# Patient Record
Sex: Male | Born: 1975 | Race: Black or African American | Hispanic: No | Marital: Married | Smoking: Former smoker
Health system: Southern US, Community
[De-identification: ages and names within clinical notes are randomized; demographics above are authoritative.]

## PROBLEM LIST (undated history)

## (undated) DIAGNOSIS — K5792 Diverticulitis of intestine, part unspecified, without perforation or abscess without bleeding: Secondary | ICD-10-CM

## (undated) DIAGNOSIS — T7840XA Allergy, unspecified, initial encounter: Secondary | ICD-10-CM

## (undated) DIAGNOSIS — F410 Panic disorder [episodic paroxysmal anxiety] without agoraphobia: Secondary | ICD-10-CM

## (undated) HISTORY — DX: Panic disorder (episodic paroxysmal anxiety): F41.0

## (undated) HISTORY — DX: Allergy, unspecified, initial encounter: T78.40XA

## (undated) HISTORY — PX: FRACTURE SURGERY: SHX138

## (undated) HISTORY — DX: Diverticulitis of intestine, part unspecified, without perforation or abscess without bleeding: K57.92

## (undated) HISTORY — PX: ABDOMINAL SURGERY: SHX537

## (undated) HISTORY — PX: FACIAL COSMETIC SURGERY: SHX629

---

## 2010-05-19 ENCOUNTER — Inpatient Hospital Stay (HOSPITAL_COMMUNITY): Admission: EM | Admit: 2010-05-19 | Discharge: 2010-05-20 | Payer: Self-pay | Admitting: Emergency Medicine

## 2011-01-11 LAB — BASIC METABOLIC PANEL
BUN: 8 mg/dL (ref 6–23)
CO2: 29 mEq/L (ref 19–32)
Chloride: 104 mEq/L (ref 96–112)
Chloride: 107 mEq/L (ref 96–112)
Creatinine, Ser: 1.04 mg/dL (ref 0.4–1.5)
GFR calc Af Amer: >60 mL/min (ref >60)
Glucose, Bld: 101 mg/dL — ABNORMAL HIGH (ref 70–99)
Potassium: 3.5 mEq/L (ref 3.5–5.1)
Sodium: 137 mEq/L (ref 135–145)
Sodium: 138 mEq/L (ref 135–145)

## 2011-01-11 LAB — CBC
HCT: 41.5 % (ref 39.0–52.0)
MCHC: 34.3 g/dL (ref 30.0–36.0)
MCV: 91.8 fL (ref 78.0–100.0)
Platelets: 222 10*3/uL (ref 150–400)
RBC: 4.53 MIL/uL (ref 4.22–5.81)
WBC: 5.4 10*3/uL (ref 4.0–10.5)

## 2011-01-11 LAB — HEMOGLOBIN A1C: Mean Plasma Glucose: 117 mg/dL — ABNORMAL HIGH (ref <117)

## 2011-01-11 LAB — URINALYSIS, ROUTINE W REFLEX MICROSCOPIC
Bilirubin Urine: NEGATIVE
Nitrite: NEGATIVE
Specific Gravity, Urine: 1.025 (ref 1.005–1.030)

## 2011-01-11 LAB — HOMOCYSTEINE: Homocysteine: 9.5 umol/L (ref 4.0–15.4)

## 2011-01-11 LAB — DIFFERENTIAL
Basophils Relative: 1 % (ref 0–1)
Monocytes Absolute: 0.5 10*3/uL (ref 0.1–1.0)
Monocytes Relative: 10 % (ref 3–12)
Neutro Abs: 2.8 10*3/uL (ref 1.7–7.7)

## 2011-01-11 LAB — LIPID PANEL
Cholesterol: 188 mg/dL (ref 0–200)
LDL Cholesterol: 124 mg/dL — ABNORMAL HIGH (ref 0–99)
Triglycerides: 166 mg/dL — ABNORMAL HIGH (ref <150)

## 2011-01-11 LAB — POCT CARDIAC MARKERS
CKMB, poc: 1.4 ng/mL (ref 1.0–8.0)
Myoglobin, poc: 111 ng/mL (ref 12–200)
Troponin i, poc: <0.05 ng/mL (ref 0.00–0.09)

## 2011-01-11 LAB — URINE CULTURE

## 2011-01-11 LAB — CARDIAC PANEL(CRET KIN+CKTOT+MB+TROPI)
CK, MB: 2.4 ng/mL (ref 0.3–4.0)
Relative Index: 0.5 (ref 0.0–2.5)
Total CK: 470 U/L — ABNORMAL HIGH (ref 7–232)
Troponin I: 0.01 ng/mL (ref 0.00–0.06)

## 2011-01-11 LAB — APTT: aPTT: 32 seconds (ref 24–37)

## 2011-01-11 LAB — RAPID URINE DRUG SCREEN, HOSP PERFORMED
Amphetamines: NOT DETECTED
Barbiturates: NOT DETECTED
Benzodiazepines: NOT DETECTED
Opiates: NOT DETECTED

## 2012-08-17 ENCOUNTER — Encounter (HOSPITAL_COMMUNITY): Payer: Self-pay

## 2012-08-17 ENCOUNTER — Emergency Department (HOSPITAL_COMMUNITY)
Admission: EM | Admit: 2012-08-17 | Discharge: 2012-08-17 | Disposition: A | Discharge status: Home / Self Care | Payer: Managed Care, Other (non HMO) | Attending: Emergency Medicine | Admitting: Emergency Medicine

## 2012-08-17 DIAGNOSIS — F17200 Nicotine dependence, unspecified, uncomplicated: Secondary | ICD-10-CM | POA: Insufficient documentation

## 2012-08-17 DIAGNOSIS — Z23 Encounter for immunization: Secondary | ICD-10-CM | POA: Insufficient documentation

## 2012-08-17 DIAGNOSIS — S90569A Insect bite (nonvenomous), unspecified ankle, initial encounter: Secondary | ICD-10-CM | POA: Insufficient documentation

## 2012-08-17 DIAGNOSIS — W57XXXA Bitten or stung by nonvenomous insect and other nonvenomous arthropods, initial encounter: Secondary | ICD-10-CM

## 2012-08-17 DIAGNOSIS — Y9389 Activity, other specified: Secondary | ICD-10-CM | POA: Insufficient documentation

## 2012-08-17 DIAGNOSIS — Y92009 Unspecified place in unspecified non-institutional (private) residence as the place of occurrence of the external cause: Secondary | ICD-10-CM | POA: Insufficient documentation

## 2012-08-17 MED ORDER — TETANUS-DIPHTH-ACELL PERTUSSIS 5-2.5-18.5 LF-MCG/0.5 IM SUSP
0.5000 mL | Freq: Once | INTRAMUSCULAR | Status: AC
  Administered 2012-08-17: 0.5 mL via INTRAMUSCULAR
  Filled 2012-08-17: qty 0.5
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
## 2012-08-17 NOTE — ED Notes (Signed)
Pt reports approx 2 hours ago, that it felt like something "jumped up and bit me" on right lateral leg, just below knee.  States this happened while he was cutting down trees.  Two small ?puncture wounds noted to right lateral knee.  No swelling noted to area; ambulatory w/o difficulty.

## 2012-08-17 NOTE — ED Notes (Signed)
Pt reports felt something bite him on his r leg.  Pt says lips feel a little numb and says pain is shooting down r leg.  Pt has 2 small puncture wounds to r lower leg.  Area slightly discolored around wound.  Pt says did not see what bit him.  Says he was cutting some trees down.

## 2012-11-16 ENCOUNTER — Emergency Department (HOSPITAL_COMMUNITY)
Admission: EM | Admit: 2012-11-16 | Discharge: 2012-11-16 | Disposition: A | Discharge status: Home / Self Care | Payer: Managed Care, Other (non HMO) | Attending: Emergency Medicine | Admitting: Emergency Medicine

## 2012-11-16 ENCOUNTER — Emergency Department (HOSPITAL_COMMUNITY): Payer: Managed Care, Other (non HMO)

## 2012-11-16 ENCOUNTER — Encounter (HOSPITAL_COMMUNITY): Payer: Self-pay | Admitting: *Deleted

## 2012-11-16 DIAGNOSIS — W010XXA Fall on same level from slipping, tripping and stumbling without subsequent striking against object, initial encounter: Secondary | ICD-10-CM | POA: Insufficient documentation

## 2012-11-16 DIAGNOSIS — I252 Old myocardial infarction: Secondary | ICD-10-CM | POA: Insufficient documentation

## 2012-11-16 DIAGNOSIS — X500XXA Overexertion from strenuous movement or load, initial encounter: Secondary | ICD-10-CM | POA: Insufficient documentation

## 2012-11-16 DIAGNOSIS — F17200 Nicotine dependence, unspecified, uncomplicated: Secondary | ICD-10-CM | POA: Insufficient documentation

## 2012-11-16 DIAGNOSIS — S93409A Sprain of unspecified ligament of unspecified ankle, initial encounter: Secondary | ICD-10-CM | POA: Insufficient documentation

## 2012-11-16 DIAGNOSIS — Y939 Activity, unspecified: Secondary | ICD-10-CM | POA: Insufficient documentation

## 2012-11-16 DIAGNOSIS — Y9289 Other specified places as the place of occurrence of the external cause: Secondary | ICD-10-CM | POA: Insufficient documentation

## 2012-11-16 MED ORDER — HYDROCODONE-ACETAMINOPHEN 5-325 MG PO TABS: 1.0000 | ORAL_TABLET | Freq: Four times a day (QID) | ORAL | Status: DC | PRN

## 2012-11-16 MED ORDER — HYDROCODONE-ACETAMINOPHEN 5-325 MG PO TABS
1.0000 | ORAL_TABLET | Freq: Once | ORAL | Status: AC
  Administered 2012-11-16: 1 via ORAL
  Filled 2012-11-16: qty 1

## 2012-11-16 NOTE — ED Notes (Signed)
Pain,  Rt foot, slid and fell injurying rt foot.
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
## 2012-11-19 NOTE — ED Provider Notes (Signed)
Medical screening examination/treatment/procedure(s) were performed by non-physician practitioner and as supervising physician I was immediately available for consultation/collaboration.  Glynn Octave, MD 11/19/12 [phone]

## 2012-11-30 ENCOUNTER — Emergency Department (HOSPITAL_COMMUNITY): Payer: Managed Care, Other (non HMO)

## 2012-11-30 ENCOUNTER — Emergency Department (HOSPITAL_COMMUNITY)
Admission: EM | Admit: 2012-11-30 | Discharge: 2012-11-30 | Disposition: A | Discharge status: Home / Self Care | Payer: Managed Care, Other (non HMO) | Attending: Emergency Medicine | Admitting: Emergency Medicine

## 2012-11-30 ENCOUNTER — Encounter (HOSPITAL_COMMUNITY): Payer: Self-pay | Admitting: *Deleted

## 2012-11-30 DIAGNOSIS — I252 Old myocardial infarction: Secondary | ICD-10-CM | POA: Insufficient documentation

## 2012-11-30 DIAGNOSIS — K5732 Diverticulitis of large intestine without perforation or abscess without bleeding: Secondary | ICD-10-CM | POA: Insufficient documentation

## 2012-11-30 DIAGNOSIS — R0602 Shortness of breath: Secondary | ICD-10-CM | POA: Insufficient documentation

## 2012-11-30 DIAGNOSIS — K5792 Diverticulitis of intestine, part unspecified, without perforation or abscess without bleeding: Secondary | ICD-10-CM

## 2012-11-30 DIAGNOSIS — F17200 Nicotine dependence, unspecified, uncomplicated: Secondary | ICD-10-CM | POA: Insufficient documentation

## 2012-11-30 LAB — CBC WITH DIFFERENTIAL/PLATELET
Basophils Relative: 0 % (ref 0–1)
Lymphs Abs: 1.7 10*3/uL (ref 0.7–4.0)
MCHC: 35.1 g/dL (ref 30.0–36.0)
Monocytes Relative: 7 % (ref 3–12)
Neutro Abs: 4.5 10*3/uL (ref 1.7–7.7)
Neutrophils Relative %: 65 % (ref 43–77)
Platelets: 252 10*3/uL (ref 150–400)
RBC: 4.91 MIL/uL (ref 4.22–5.81)
WBC: 6.9 10*3/uL (ref 4.0–10.5)

## 2012-11-30 LAB — COMPREHENSIVE METABOLIC PANEL
BUN: 13 mg/dL (ref 6–23)
Calcium: 9.2 mg/dL (ref 8.4–10.5)
Creatinine, Ser: 0.95 mg/dL (ref 0.50–1.35)
GFR calc Af Amer: >90 mL/min (ref >90)
GFR calc non Af Amer: >90 mL/min (ref >90)
Glucose, Bld: 135 mg/dL — ABNORMAL HIGH (ref 70–99)
Total Protein: 7.5 g/dL (ref 6.0–8.3)

## 2012-11-30 LAB — LIPASE, BLOOD: Lipase: 20 U/L (ref 11–59)

## 2012-11-30 LAB — URINALYSIS, ROUTINE W REFLEX MICROSCOPIC
Nitrite: NEGATIVE
Protein, ur: NEGATIVE mg/dL
Specific Gravity, Urine: 1.02 (ref 1.005–1.030)
Urobilinogen, UA: 4 mg/dL — ABNORMAL HIGH (ref 0.0–1.0)

## 2012-11-30 MED ORDER — METRONIDAZOLE 500 MG PO TABS: 500.0000 mg | ORAL_TABLET | Freq: Two times a day (BID) | ORAL | Status: DC

## 2012-11-30 MED ORDER — SODIUM CHLORIDE 0.9 % IV SOLN
1000.0000 mL | INTRAVENOUS | Status: DC
  Administered 2012-11-30: 1000 mL via INTRAVENOUS

## 2012-11-30 MED ORDER — HYDROCODONE-ACETAMINOPHEN 5-325 MG PO TABS: 1.0000 | ORAL_TABLET | ORAL | Status: AC | PRN

## 2012-11-30 MED ORDER — IOHEXOL 300 MG/ML  SOLN
100.0000 mL | Freq: Once | INTRAMUSCULAR | Status: AC | PRN
  Administered 2012-11-30: 100 mL via INTRAVENOUS

## 2012-11-30 MED ORDER — IOHEXOL 300 MG/ML  SOLN
50.0000 mL | Freq: Once | INTRAMUSCULAR | Status: AC | PRN
  Administered 2012-11-30: 50 mL via ORAL

## 2012-11-30 MED ORDER — CIPROFLOXACIN HCL 500 MG PO TABS: 500.0000 mg | ORAL_TABLET | Freq: Two times a day (BID) | ORAL | Status: DC

## 2012-11-30 NOTE — ED Notes (Signed)
C/o lower abd pain onset 4 days ago; denies n/v/d; states last normal BM this morning; denies urinary s/s.  Also c/o lower back pain.
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
## 2017-12-30 ENCOUNTER — Ambulatory Visit (INDEPENDENT_AMBULATORY_CARE_PROVIDER_SITE_OTHER): Payer: Medicaid Other | Admitting: Family Medicine

## 2017-12-30 ENCOUNTER — Encounter: Payer: Self-pay | Admitting: Family Medicine

## 2017-12-30 ENCOUNTER — Other Ambulatory Visit (HOSPITAL_COMMUNITY)
Admission: RE | Admit: 2017-12-30 | Discharge: 2017-12-30 | Disposition: A | Discharge status: Home / Self Care | Payer: Medicaid Other | Source: Ambulatory Visit | Attending: Family Medicine | Admitting: Family Medicine

## 2017-12-30 ENCOUNTER — Other Ambulatory Visit: Payer: Self-pay

## 2017-12-30 VITALS — BP 120/80 | HR 88 | Temp 97.8°F | Resp 16 | Ht 69.0 in | Wt 198.0 lb

## 2017-12-30 DIAGNOSIS — Z0000 Encounter for general adult medical examination without abnormal findings: Secondary | ICD-10-CM | POA: Diagnosis not present

## 2017-12-30 DIAGNOSIS — E782 Mixed hyperlipidemia: Secondary | ICD-10-CM | POA: Insufficient documentation

## 2017-12-30 DIAGNOSIS — Z720 Tobacco use: Secondary | ICD-10-CM | POA: Insufficient documentation

## 2017-12-30 DIAGNOSIS — F1290 Cannabis use, unspecified, uncomplicated: Secondary | ICD-10-CM | POA: Diagnosis not present

## 2017-12-30 DIAGNOSIS — Z23 Encounter for immunization: Secondary | ICD-10-CM

## 2017-12-30 DIAGNOSIS — Z113 Encounter for screening for infections with a predominantly sexual mode of transmission: Secondary | ICD-10-CM | POA: Insufficient documentation

## 2017-12-30 DIAGNOSIS — R7303 Prediabetes: Secondary | ICD-10-CM
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
## 2017-12-31 ENCOUNTER — Other Ambulatory Visit: Payer: Self-pay

## 2017-12-31 ENCOUNTER — Emergency Department (HOSPITAL_COMMUNITY)
Admission: EM | Admit: 2017-12-31 | Discharge: 2017-12-31 | Disposition: A | Discharge status: Home / Self Care | Payer: Medicaid Other | Attending: Emergency Medicine | Admitting: Emergency Medicine

## 2017-12-31 ENCOUNTER — Encounter (HOSPITAL_COMMUNITY): Payer: Self-pay | Admitting: Emergency Medicine

## 2017-12-31 DIAGNOSIS — M542 Cervicalgia: Secondary | ICD-10-CM | POA: Insufficient documentation

## 2017-12-31 DIAGNOSIS — Z5321 Procedure and treatment not carried out due to patient leaving prior to being seen by health care provider: Secondary | ICD-10-CM | POA: Insufficient documentation

## 2017-12-31 LAB — LIPID PANEL
Cholesterol: 224 mg/dL — ABNORMAL HIGH (ref <200)
HDL: 51 mg/dL (ref >40)
LDL Cholesterol (Calc): 153 mg/dL (calc) — ABNORMAL HIGH
Non-HDL Cholesterol (Calc): 173 mg/dL (calc) — ABNORMAL HIGH (ref <130)
Total CHOL/HDL Ratio: 4.4 (calc) (ref <5.0)
Triglycerides: 90 mg/dL (ref <150)

## 2017-12-31 LAB — CBC WITH DIFFERENTIAL/PLATELET
Basophils Absolute: 29 cells/uL (ref 0–200)
Basophils Relative: 0.4 %
Eosinophils Absolute: 292 cells/uL (ref 15–500)
Eosinophils Relative: 4 %
HCT: 43.4 % (ref 38.5–50.0)
Hemoglobin: 15.1 g/dL (ref 13.2–17.1)
Lymphs Abs: 2190 cells/uL (ref 850–3900)
MCH: 31.2 pg (ref 27.0–33.0)
MCHC: 34.8 g/dL (ref 32.0–36.0)
MCV: 89.7 fL (ref 80.0–100.0)
MPV: 9.2 fL (ref 7.5–12.5)
Monocytes Relative: 6.7 %
Neutro Abs: 4300 cells/uL (ref 1500–7800)
Neutrophils Relative %: 58.9 %
Platelets: 267 10*3/uL (ref 140–400)
RBC: 4.84 10*6/uL (ref 4.20–5.80)
RDW: 12 % (ref 11.0–15.0)
Total Lymphocyte: 30 %
WBC mixed population: 489 cells/uL (ref 200–950)
WBC: 7.3 10*3/uL (ref 3.8–10.8)

## 2017-12-31 LAB — COMPLETE METABOLIC PANEL WITH GFR
AG RATIO: 1.8 (calc) (ref 1.0–2.5)
ALT: 23 U/L (ref 9–46)
AST: 19 U/L (ref 10–40)
Albumin: 4.5 g/dL (ref 3.6–5.1)
Alkaline phosphatase (APISO): 61 U/L (ref 40–115)
BUN: 14 mg/dL (ref 7–25)
CO2: 27 mmol/L (ref 20–32)
Calcium: 9.4 mg/dL (ref 8.6–10.3)
Chloride: 106 mmol/L (ref 98–110)
Creat: 1.11 mg/dL (ref 0.60–1.35)
GFR, EST NON AFRICAN AMERICAN: 81 mL/min/{1.73_m2} (ref >=60)
GFR, Est African American: 94 mL/min/{1.73_m2} (ref >=60)
Globulin: 2.5 g/dL (calc) (ref 1.9–3.7)
Glucose, Bld: 90 mg/dL (ref 65–139)
POTASSIUM: 4.6 mmol/L (ref 3.5–5.3)
Sodium: 140 mmol/L (ref 135–146)
Total Bilirubin: 0.5 mg/dL (ref 0.2–1.2)
Total Protein: 7 g/dL (ref 6.1–8.1)

## 2017-12-31 LAB — HEMOGLOBIN A1C
Hgb A1c MFr Bld: 5.5 % of total Hgb (ref <5.7)
Mean Plasma Glucose: 111 (calc)
eAG (mmol/L): 6.2 (calc)

## 2017-12-31 LAB — HEPATITIS PANEL, ACUTE
Hep A IgM: NONREACTIVE
Hep B C IgM: NONREACTIVE
Hepatitis B Surface Ag: NONREACTIVE
Hepatitis C Ab: NONREACTIVE
SIGNAL TO CUT-OFF: 0.02 (ref <1.00)

## 2017-12-31 LAB — URINALYSIS, ROUTINE W REFLEX MICROSCOPIC
Bilirubin Urine: NEGATIVE
GLUCOSE, UA: NEGATIVE
Hgb urine dipstick: NEGATIVE
Ketones, ur: NEGATIVE
LEUKOCYTES UA: NEGATIVE
Nitrite: NEGATIVE
PH: 5.5 (ref 5.0–8.0)
Protein, ur: NEGATIVE
Specific Gravity, Urine: 1.013 (ref 1.001–1.03)

## 2017-12-31 LAB — URINE CYTOLOGY ANCILLARY ONLY
CHLAMYDIA, DNA PROBE: NEGATIVE
Neisseria Gonorrhea: NEGATIVE
TRICH (WINDOWPATH): NEGATIVE

## 2017-12-31 LAB — PSA, TOTAL AND FREE
PSA, % Free: 40 % (calc) (ref >25)
PSA, Free: 0.2 ng/mL
PSA, TOTAL: 0.5 ng/mL (ref <=4.0)

## 2017-12-31 LAB — HIV ANTIBODY (ROUTINE TESTING W REFLEX): HIV 1&2 Ab, 4th Generation: NONREACTIVE

## 2017-12-31 LAB — RPR: RPR Ser Ql: NONREACTIVE
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
## 2017-12-31 NOTE — ED Notes (Addendum)
Called to reassess - no answer in waiting room x 2.

## 2017-12-31 NOTE — ED Notes (Signed)
No answer in waiting room X1,  

## 2017-12-31 NOTE — ED Notes (Signed)
No answer x 3 in waiting and surrounding area.

## 2018-01-05 ENCOUNTER — Encounter: Payer: Self-pay | Admitting: Family Medicine

## 2018-01-20 ENCOUNTER — Encounter (HOSPITAL_COMMUNITY): Payer: Self-pay | Admitting: Emergency Medicine

## 2018-01-20 ENCOUNTER — Emergency Department (HOSPITAL_COMMUNITY): Payer: Medicaid Other

## 2018-01-20 ENCOUNTER — Emergency Department (HOSPITAL_COMMUNITY)
Admission: EM | Admit: 2018-01-20 | Discharge: 2018-01-20 | Disposition: A | Discharge status: Home / Self Care | Payer: Medicaid Other | Attending: Emergency Medicine | Admitting: Emergency Medicine

## 2018-01-20 ENCOUNTER — Other Ambulatory Visit: Payer: Self-pay

## 2018-01-20 DIAGNOSIS — Z79899 Other long term (current) drug therapy: Secondary | ICD-10-CM | POA: Diagnosis not present

## 2018-01-20 DIAGNOSIS — Y92410 Unspecified street and highway as the place of occurrence of the external cause: Secondary | ICD-10-CM | POA: Diagnosis not present

## 2018-01-20 DIAGNOSIS — S3992XA Unspecified injury of lower back, initial encounter: Secondary | ICD-10-CM | POA: Diagnosis present

## 2018-01-20 DIAGNOSIS — Y939 Activity, unspecified: Secondary | ICD-10-CM | POA: Diagnosis not present

## 2018-01-20 DIAGNOSIS — F17210 Nicotine dependence, cigarettes, uncomplicated: Secondary | ICD-10-CM | POA: Diagnosis not present

## 2018-01-20 DIAGNOSIS — M545 Low back pain, unspecified: Secondary | ICD-10-CM

## 2018-01-20 DIAGNOSIS — M542 Cervicalgia: Secondary | ICD-10-CM

## 2018-01-20 DIAGNOSIS — Y999 Unspecified external cause status: Secondary | ICD-10-CM | POA: Insufficient documentation

## 2018-01-20 MED ORDER — METHOCARBAMOL 500 MG PO TABS: 500.0000 mg | ORAL_TABLET | Freq: Two times a day (BID) | ORAL | 0 refills | Status: AC

## 2018-01-20 MED ORDER — DIAZEPAM 5 MG PO TABS
10.0000 mg | ORAL_TABLET | Freq: Once | ORAL | Status: AC
  Administered 2018-01-20: 10 mg via ORAL
  Filled 2018-01-20: qty 2

## 2018-01-20 MED ORDER — ONDANSETRON HCL 4 MG PO TABS
4.0000 mg | ORAL_TABLET | Freq: Once | ORAL | Status: AC
  Administered 2018-01-20: 4 mg via ORAL
  Filled 2018-01-20: qty 1

## 2018-01-20 MED ORDER — HYDROCODONE-ACETAMINOPHEN 5-325 MG PO TABS
2.0000 | ORAL_TABLET | Freq: Once | ORAL | Status: AC
Start: 2018-01-20 — End: 2018-01-20
  Administered 2018-01-20: 2 via ORAL
  Filled 2018-01-20: qty 2

## 2018-01-20 MED ORDER — DICLOFENAC SODIUM 75 MG PO TBEC: 75.0000 mg | DELAYED_RELEASE_TABLET | Freq: Two times a day (BID) | ORAL | 0 refills | Status: AC

## 2018-01-20 NOTE — Discharge Instructions (Addendum)
Return if any problems.  Follow up with your Physician for recheck  
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
## 2018-01-20 NOTE — ED Notes (Signed)
Pt taken to xray 
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
## 2018-01-20 NOTE — ED Provider Notes (Signed)
Pt's ct's returned.  Ct reviewed and discussed with pt Meds ordered this encounter  Medications  . diazepam (VALIUM) tablet 10 mg  . HYDROcodone-acetaminophen (NORCO/VICODIN) 5-325 MG per tablet 2 tablet  . ondansetron (ZOFRAN) tablet 4 mg  . diclofenac (VOLTAREN) 75 MG EC tablet    Sig: Take 1 tablet (75 mg total) by mouth 2 (two) times daily.    Dispense:  20 tablet    Refill:  0    Order Specific Question:   Supervising Provider    Answer:   MILLER, BRIAN [[id number]]  . methocarbamol (ROBAXIN) 500 MG tablet    Sig: Take 1 tablet (500 mg total) by mouth 2 (two) times daily.    Dispense:  20 tablet    Refill:  0    Order Specific Question:   Supervising Provider    Answer:   Eber HongMILLER, BRIAN [[id number]]   An After Visit Summary was printed and given to the patient.    Osie CheeksSofia, Leslie K, PA-C 01/20/18 1627    Benjiman CorePickering, Nathan, MD 01/21/18 [phone]

## 2018-01-27 ENCOUNTER — Ambulatory Visit: Payer: Medicaid Other | Admitting: Family Medicine

## 2018-02-15 ENCOUNTER — Encounter (HOSPITAL_COMMUNITY): Payer: Self-pay | Admitting: *Deleted

## 2018-02-15 ENCOUNTER — Emergency Department (HOSPITAL_COMMUNITY)
Admission: EM | Admit: 2018-02-15 | Discharge: 2018-02-15 | Disposition: A | Discharge status: Home / Self Care | Payer: Medicaid Other | Attending: Emergency Medicine | Admitting: Emergency Medicine

## 2018-02-15 ENCOUNTER — Emergency Department (HOSPITAL_COMMUNITY): Payer: Medicaid Other

## 2018-02-15 ENCOUNTER — Other Ambulatory Visit: Payer: Self-pay

## 2018-02-15 DIAGNOSIS — Z7982 Long term (current) use of aspirin: Secondary | ICD-10-CM | POA: Insufficient documentation

## 2018-02-15 DIAGNOSIS — Z87891 Personal history of nicotine dependence: Secondary | ICD-10-CM | POA: Insufficient documentation

## 2018-02-15 DIAGNOSIS — Y92410 Unspecified street and highway as the place of occurrence of the external cause: Secondary | ICD-10-CM | POA: Diagnosis not present

## 2018-02-15 DIAGNOSIS — Y9389 Activity, other specified: Secondary | ICD-10-CM | POA: Insufficient documentation

## 2018-02-15 DIAGNOSIS — Y999 Unspecified external cause status: Secondary | ICD-10-CM | POA: Insufficient documentation

## 2018-02-15 DIAGNOSIS — S76211A Strain of adductor muscle, fascia and tendon of right thigh, initial encounter: Secondary | ICD-10-CM | POA: Diagnosis not present

## 2018-02-15 DIAGNOSIS — Z79899 Other long term (current) drug therapy: Secondary | ICD-10-CM | POA: Insufficient documentation

## 2018-02-15 DIAGNOSIS — T07XXXA Unspecified multiple injuries, initial encounter: Secondary | ICD-10-CM | POA: Insufficient documentation

## 2018-02-15 MED ORDER — IBUPROFEN 800 MG PO TABS: 800.0000 mg | ORAL_TABLET | Freq: Three times a day (TID) | ORAL | 0 refills | Status: AC | PRN

## 2018-02-15 MED ORDER — BACITRACIN ZINC 500 UNIT/GM EX OINT: 1.0000 "application " | TOPICAL_OINTMENT | Freq: Two times a day (BID) | CUTANEOUS | 0 refills | Status: AC

## 2018-02-15 MED ORDER — IBUPROFEN 800 MG PO TABS
800.0000 mg | ORAL_TABLET | Freq: Once | ORAL | Status: AC
Start: 2018-02-15 — End: 2018-02-15
  Administered 2018-02-15: 800 mg via ORAL
  Filled 2018-02-15: qty 1

## 2018-02-15 MED ORDER — HYDROCODONE-ACETAMINOPHEN 5-325 MG PO TABS: 1.0000 | ORAL_TABLET | ORAL | 0 refills | Status: DC | PRN
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
## 2018-02-20 ENCOUNTER — Emergency Department (HOSPITAL_COMMUNITY)
Admission: EM | Admit: 2018-02-20 | Discharge: 2018-02-20 | Disposition: A | Discharge status: Home / Self Care | Payer: Medicaid Other | Attending: Emergency Medicine | Admitting: Emergency Medicine

## 2018-02-20 ENCOUNTER — Emergency Department (HOSPITAL_COMMUNITY): Payer: Medicaid Other

## 2018-02-20 ENCOUNTER — Encounter (HOSPITAL_COMMUNITY): Payer: Self-pay

## 2018-02-20 ENCOUNTER — Other Ambulatory Visit: Payer: Self-pay

## 2018-02-20 DIAGNOSIS — S40811A Abrasion of right upper arm, initial encounter: Secondary | ICD-10-CM | POA: Insufficient documentation

## 2018-02-20 DIAGNOSIS — Z79899 Other long term (current) drug therapy: Secondary | ICD-10-CM | POA: Diagnosis not present

## 2018-02-20 DIAGNOSIS — M25562 Pain in left knee: Secondary | ICD-10-CM | POA: Insufficient documentation

## 2018-02-20 DIAGNOSIS — Y929 Unspecified place or not applicable: Secondary | ICD-10-CM | POA: Insufficient documentation

## 2018-02-20 DIAGNOSIS — Y999 Unspecified external cause status: Secondary | ICD-10-CM | POA: Insufficient documentation

## 2018-02-20 DIAGNOSIS — Y939 Activity, unspecified: Secondary | ICD-10-CM | POA: Diagnosis not present

## 2018-02-20 DIAGNOSIS — Z87891 Personal history of nicotine dependence: Secondary | ICD-10-CM | POA: Insufficient documentation

## 2018-02-20 DIAGNOSIS — S4991XA Unspecified injury of right shoulder and upper arm, initial encounter: Secondary | ICD-10-CM | POA: Diagnosis present

## 2018-02-20 DIAGNOSIS — T07XXXA Unspecified multiple injuries, initial encounter: Secondary | ICD-10-CM

## 2018-02-20 LAB — CBC WITH DIFFERENTIAL/PLATELET
Basophils Absolute: 0 10*3/uL (ref 0.0–0.1)
Basophils Relative: 0 %
EOS PCT: 2 %
Eosinophils Absolute: 0.2 10*3/uL (ref 0.0–0.7)
HEMATOCRIT: 40.9 % (ref 39.0–52.0)
Hemoglobin: 13.5 g/dL (ref 13.0–17.0)
LYMPHS ABS: 1.6 10*3/uL (ref 0.7–4.0)
LYMPHS PCT: 18 %
MCH: 30.1 pg (ref 26.0–34.0)
MCHC: 33 g/dL (ref 30.0–36.0)
MCV: 91.3 fL (ref 78.0–100.0)
MONO ABS: 0.8 10*3/uL (ref 0.1–1.0)
MONOS PCT: 10 %
NEUTROS ABS: 6.2 10*3/uL (ref 1.7–7.7)
Neutrophils Relative %: 70 %
PLATELETS: 275 10*3/uL (ref 150–400)
RBC: 4.48 MIL/uL (ref 4.22–5.81)
RDW: 12.1 % (ref 11.5–15.5)
WBC: 8.7 10*3/uL (ref 4.0–10.5)

## 2018-02-20 LAB — BASIC METABOLIC PANEL
ANION GAP: 10 (ref 5–15)
BUN: 10 mg/dL (ref 6–20)
CHLORIDE: 102 mmol/L (ref 101–111)
CO2: 27 mmol/L (ref 22–32)
Calcium: 9 mg/dL (ref 8.9–10.3)
Creatinine, Ser: 0.87 mg/dL (ref 0.61–1.24)
GFR calc Af Amer: >60 mL/min (ref >60)
GFR calc non Af Amer: >60 mL/min (ref >60)
GLUCOSE: 106 mg/dL — AB (ref 65–99)
POTASSIUM: 3.5 mmol/L (ref 3.5–5.1)
Sodium: 139 mmol/L (ref 135–145)

## 2018-02-20 MED ORDER — BACITRACIN ZINC 500 UNIT/GM EX OINT
1.0000 "application " | TOPICAL_OINTMENT | Freq: Once | CUTANEOUS | Status: DC
  Filled 2018-02-20: qty 13.5

## 2018-02-20 MED ORDER — FENTANYL CITRATE (PF) 100 MCG/2ML IJ SOLN
100.0000 ug | Freq: Once | INTRAMUSCULAR | Status: AC
  Administered 2018-02-20: 100 ug via INTRAVENOUS
  Filled 2018-02-20: qty 2

## 2018-02-20 MED ORDER — BACITRACIN ZINC 500 UNIT/GM EX OINT
TOPICAL_OINTMENT | CUTANEOUS | Status: AC
  Filled 2018-02-20: qty 2.7

## 2018-02-20 MED ORDER — CEPHALEXIN 250 MG PO CAPS: 250.0000 mg | ORAL_CAPSULE | Freq: Four times a day (QID) | ORAL | 0 refills | Status: DC

## 2018-02-20 MED ORDER — KETOROLAC TROMETHAMINE 30 MG/ML IJ SOLN
30.0000 mg | Freq: Once | INTRAMUSCULAR | Status: AC
  Administered 2018-02-20: 30 mg via INTRAVENOUS
  Filled 2018-02-20: qty 1

## 2018-02-20 NOTE — ED Notes (Signed)
Pt returned from xray

## 2018-02-20 NOTE — ED Notes (Signed)
Patient transported to X-ray 

## 2018-02-20 NOTE — ED Notes (Signed)
EDP at bedside  

## 2018-02-20 NOTE — Discharge Instructions (Signed)
Continue the ibuprofen, you can also take Tylenol to supplement, apply antibiotic ointment to the wounds daily and keep a bandage covering the abrasions, consider seeing an orthopedic doctor if you continue to have pain in your knee your hand

## 2018-02-20 NOTE — ED Notes (Signed)
EDP at bedside updating patient and family. 
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
## 2018-04-02 ENCOUNTER — Encounter: Payer: Self-pay | Admitting: Family Medicine

## 2018-04-28 ENCOUNTER — Ambulatory Visit: Payer: Medicaid Other | Admitting: Family Medicine

## 2018-05-06 ENCOUNTER — Other Ambulatory Visit: Payer: Self-pay

## 2018-05-06 ENCOUNTER — Encounter: Payer: Self-pay | Admitting: Family Medicine

## 2018-05-06 ENCOUNTER — Ambulatory Visit (INDEPENDENT_AMBULATORY_CARE_PROVIDER_SITE_OTHER): Payer: Medicaid Other | Admitting: Family Medicine

## 2018-05-06 VITALS — BP 140/90 | HR 16 | Temp 98.5°F | Resp 16 | Ht 69.0 in | Wt 186.0 lb

## 2018-05-06 DIAGNOSIS — M25562 Pain in left knee: Secondary | ICD-10-CM | POA: Diagnosis not present

## 2018-05-06 DIAGNOSIS — F0781 Postconcussional syndrome: Secondary | ICD-10-CM | POA: Diagnosis not present
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
## 2018-05-19 ENCOUNTER — Ambulatory Visit (INDEPENDENT_AMBULATORY_CARE_PROVIDER_SITE_OTHER): Payer: Medicaid Other | Admitting: Orthopaedic Surgery

## 2018-05-19 ENCOUNTER — Other Ambulatory Visit (INDEPENDENT_AMBULATORY_CARE_PROVIDER_SITE_OTHER): Payer: Self-pay | Admitting: Orthopaedic Surgery

## 2018-05-19 ENCOUNTER — Encounter (INDEPENDENT_AMBULATORY_CARE_PROVIDER_SITE_OTHER): Payer: Self-pay | Admitting: Orthopaedic Surgery

## 2018-05-19 VITALS — BP 125/76 | HR 59 | Ht 69.0 in | Wt 186.0 lb

## 2018-05-19 DIAGNOSIS — M25562 Pain in left knee: Secondary | ICD-10-CM | POA: Diagnosis not present

## 2018-05-19 DIAGNOSIS — G8929 Other chronic pain: Secondary | ICD-10-CM
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
## 2018-05-26 ENCOUNTER — Other Ambulatory Visit (INDEPENDENT_AMBULATORY_CARE_PROVIDER_SITE_OTHER): Payer: Self-pay | Admitting: Radiology

## 2018-05-27 ENCOUNTER — Ambulatory Visit (HOSPITAL_COMMUNITY)
Admission: RE | Admit: 2018-05-27 | Discharge: 2018-05-27 | Disposition: A | Discharge status: Home / Self Care | Payer: Medicaid Other | Source: Ambulatory Visit | Attending: Orthopaedic Surgery | Admitting: Orthopaedic Surgery

## 2018-05-27 DIAGNOSIS — S83512D Sprain of anterior cruciate ligament of left knee, subsequent encounter: Secondary | ICD-10-CM | POA: Diagnosis not present

## 2018-05-27 DIAGNOSIS — M25562 Pain in left knee: Secondary | ICD-10-CM | POA: Diagnosis not present

## 2018-07-06 ENCOUNTER — Ambulatory Visit (INDEPENDENT_AMBULATORY_CARE_PROVIDER_SITE_OTHER): Payer: Medicaid Other | Admitting: Family Medicine

## 2018-07-06 ENCOUNTER — Encounter: Payer: Self-pay | Admitting: Family Medicine

## 2018-07-06 ENCOUNTER — Other Ambulatory Visit: Payer: Self-pay

## 2018-07-06 VITALS — BP 142/80 | HR 70 | Temp 98.8°F | Resp 12 | Ht 69.0 in | Wt 185.0 lb

## 2018-07-06 DIAGNOSIS — M25562 Pain in left knee: Secondary | ICD-10-CM
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
## 2018-07-13 ENCOUNTER — Other Ambulatory Visit (HOSPITAL_COMMUNITY): Payer: Self-pay | Admitting: Radiology

## 2018-07-13 ENCOUNTER — Emergency Department (HOSPITAL_COMMUNITY): Payer: Medicaid Other

## 2018-07-13 ENCOUNTER — Emergency Department (HOSPITAL_COMMUNITY)
Admission: EM | Admit: 2018-07-13 | Discharge: 2018-07-14 | Disposition: A | Discharge status: Other Acute Inpatient Hospital | Payer: Medicaid Other | Attending: Emergency Medicine | Admitting: Emergency Medicine

## 2018-07-13 DIAGNOSIS — S022XXB Fracture of nasal bones, initial encounter for open fracture: Secondary | ICD-10-CM

## 2018-07-13 DIAGNOSIS — Z8719 Personal history of other diseases of the digestive system: Secondary | ICD-10-CM | POA: Diagnosis not present

## 2018-07-13 DIAGNOSIS — Z23 Encounter for immunization: Secondary | ICD-10-CM | POA: Insufficient documentation

## 2018-07-13 DIAGNOSIS — S0912XA Laceration of muscle and tendon of head, initial encounter: Secondary | ICD-10-CM | POA: Insufficient documentation

## 2018-07-13 DIAGNOSIS — M25531 Pain in right wrist: Secondary | ICD-10-CM | POA: Insufficient documentation

## 2018-07-13 DIAGNOSIS — Y92410 Unspecified street and highway as the place of occurrence of the external cause: Secondary | ICD-10-CM | POA: Diagnosis not present

## 2018-07-13 DIAGNOSIS — S0240DA Maxillary fracture, left side, initial encounter for closed fracture: Secondary | ICD-10-CM | POA: Diagnosis not present

## 2018-07-13 DIAGNOSIS — S0181XA Laceration without foreign body of other part of head, initial encounter: Secondary | ICD-10-CM

## 2018-07-13 DIAGNOSIS — Y939 Activity, unspecified: Secondary | ICD-10-CM | POA: Diagnosis not present

## 2018-07-13 DIAGNOSIS — F10920 Alcohol use, unspecified with intoxication, uncomplicated: Secondary | ICD-10-CM

## 2018-07-13 DIAGNOSIS — S92421A Displaced fracture of distal phalanx of right great toe, initial encounter for closed fracture: Secondary | ICD-10-CM

## 2018-07-13 DIAGNOSIS — Y999 Unspecified external cause status: Secondary | ICD-10-CM | POA: Diagnosis not present

## 2018-07-13 DIAGNOSIS — Y906 Blood alcohol level of 120-199 mg/100 ml: Secondary | ICD-10-CM | POA: Diagnosis not present

## 2018-07-13 DIAGNOSIS — Z8249 Family history of ischemic heart disease and other diseases of the circulatory system: Secondary | ICD-10-CM | POA: Diagnosis not present

## 2018-07-13 DIAGNOSIS — T1490XA Injury, unspecified, initial encounter: Secondary | ICD-10-CM

## 2018-07-13 DIAGNOSIS — S0180XA Unspecified open wound of other part of head, initial encounter: Secondary | ICD-10-CM | POA: Diagnosis not present

## 2018-07-13 DIAGNOSIS — S0990XA Unspecified injury of head, initial encounter: Secondary | ICD-10-CM | POA: Diagnosis present

## 2018-07-13 LAB — COMPREHENSIVE METABOLIC PANEL
ALBUMIN: 3.6 g/dL (ref 3.5–5.0)
ALK PHOS: 59 U/L (ref 38–126)
ALT: 25 U/L (ref 0–44)
AST: 27 U/L (ref 15–41)
Anion gap: 10 (ref 5–15)
BUN: 11 mg/dL (ref 6–20)
CHLORIDE: 111 mmol/L (ref 98–111)
CO2: 22 mmol/L (ref 22–32)
CREATININE: 1.12 mg/dL (ref 0.61–1.24)
Calcium: 8.4 mg/dL — ABNORMAL LOW (ref 8.9–10.3)
GFR calc non Af Amer: >60 mL/min (ref >60)
Glucose, Bld: 126 mg/dL — ABNORMAL HIGH (ref 70–99)
POTASSIUM: 3.4 mmol/L — AB (ref 3.5–5.1)
SODIUM: 143 mmol/L (ref 135–145)
Total Bilirubin: 0.4 mg/dL (ref 0.3–1.2)
Total Protein: 6 g/dL — ABNORMAL LOW (ref 6.5–8.1)

## 2018-07-13 LAB — I-STAT CHEM 8, ED
BUN: 11 mg/dL (ref 6–20)
CALCIUM ION: 1.11 mmol/L — AB (ref 1.15–1.40)
CHLORIDE: 108 mmol/L (ref 98–111)
Creatinine, Ser: 1.5 mg/dL — ABNORMAL HIGH (ref 0.61–1.24)
GLUCOSE: 123 mg/dL — AB (ref 70–99)
HCT: 41 % (ref 39.0–52.0)
HEMOGLOBIN: 13.9 g/dL (ref 13.0–17.0)
Potassium: 3.3 mmol/L — ABNORMAL LOW (ref 3.5–5.1)
SODIUM: 143 mmol/L (ref 135–145)
TCO2: 23 mmol/L (ref 22–32)

## 2018-07-13 LAB — PROTIME-INR
INR: 1.1
Prothrombin Time: 14.1 seconds (ref 11.4–15.2)

## 2018-07-13 LAB — CBC
HEMATOCRIT: 42.6 % (ref 39.0–52.0)
Hemoglobin: 13.9 g/dL (ref 13.0–17.0)
MCH: 30.8 pg (ref 26.0–34.0)
MCHC: 32.6 g/dL (ref 30.0–36.0)
MCV: 94.2 fL (ref 78.0–100.0)
PLATELETS: 225 10*3/uL (ref 150–400)
RBC: 4.52 MIL/uL (ref 4.22–5.81)
RDW: 11.7 % (ref 11.5–15.5)
WBC: 9.9 10*3/uL (ref 4.0–10.5)

## 2018-07-13 LAB — SAMPLE TO BLOOD BANK

## 2018-07-13 LAB — CDS SEROLOGY

## 2018-07-13 LAB — ETHANOL: Alcohol, Ethyl (B): 160 mg/dL — ABNORMAL HIGH (ref <10)

## 2018-07-13 LAB — I-STAT CG4 LACTIC ACID, ED: Lactic Acid, Venous: 1.75 mmol/L (ref 0.5–1.9)

## 2018-07-13 MED ORDER — FENTANYL CITRATE (PF) 100 MCG/2ML IJ SOLN
50.0000 ug | Freq: Once | INTRAMUSCULAR | Status: AC
  Administered 2018-07-13: 50 ug via INTRAVENOUS
  Filled 2018-07-13: qty 2

## 2018-07-13 MED ORDER — TETANUS-DIPHTH-ACELL PERTUSSIS 5-2.5-18.5 LF-MCG/0.5 IM SUSP
0.5000 mL | Freq: Once | INTRAMUSCULAR | Status: AC
  Administered 2018-07-13: 0.5 mL via INTRAMUSCULAR
  Filled 2018-07-13: qty 0.5

## 2018-07-13 MED ORDER — IOHEXOL 300 MG/ML  SOLN
100.0000 mL | Freq: Once | INTRAMUSCULAR | Status: AC | PRN
  Administered 2018-07-13: 100 mL via INTRAVENOUS

## 2018-07-13 MED ORDER — CEFAZOLIN SODIUM-DEXTROSE 2-4 GM/100ML-% IV SOLN
2.0000 g | Freq: Once | INTRAVENOUS | Status: AC
  Administered 2018-07-13: 2 g via INTRAVENOUS
  Filled 2018-07-13: qty 100
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
## 2018-07-14 ENCOUNTER — Emergency Department (HOSPITAL_COMMUNITY): Payer: Medicaid Other

## 2018-07-14 ENCOUNTER — Ambulatory Visit (INDEPENDENT_AMBULATORY_CARE_PROVIDER_SITE_OTHER): Payer: Medicaid Other | Admitting: Orthopedic Surgery

## 2018-07-14 DIAGNOSIS — S0180XA Unspecified open wound of other part of head, initial encounter: Secondary | ICD-10-CM | POA: Diagnosis not present

## 2018-07-14 DIAGNOSIS — S0990XA Unspecified injury of head, initial encounter: Secondary | ICD-10-CM | POA: Diagnosis present

## 2018-07-14 DIAGNOSIS — M25531 Pain in right wrist: Secondary | ICD-10-CM | POA: Diagnosis not present

## 2018-07-14 DIAGNOSIS — Y999 Unspecified external cause status: Secondary | ICD-10-CM | POA: Diagnosis not present

## 2018-07-14 DIAGNOSIS — S0240DA Maxillary fracture, left side, initial encounter for closed fracture: Secondary | ICD-10-CM | POA: Diagnosis not present

## 2018-07-14 DIAGNOSIS — F10920 Alcohol use, unspecified with intoxication, uncomplicated: Secondary | ICD-10-CM | POA: Diagnosis not present

## 2018-07-14 DIAGNOSIS — S022XXB Fracture of nasal bones, initial encounter for open fracture: Secondary | ICD-10-CM | POA: Diagnosis not present

## 2018-07-14 DIAGNOSIS — Z8249 Family history of ischemic heart disease and other diseases of the circulatory system: Secondary | ICD-10-CM | POA: Diagnosis not present

## 2018-07-14 DIAGNOSIS — S0912XA Laceration of muscle and tendon of head, initial encounter: Secondary | ICD-10-CM | POA: Diagnosis not present

## 2018-07-14 DIAGNOSIS — Y939 Activity, unspecified: Secondary | ICD-10-CM | POA: Diagnosis not present

## 2018-07-14 DIAGNOSIS — Y906 Blood alcohol level of 120-199 mg/100 ml: Secondary | ICD-10-CM | POA: Diagnosis not present

## 2018-07-14 DIAGNOSIS — Y92410 Unspecified street and highway as the place of occurrence of the external cause: Secondary | ICD-10-CM | POA: Diagnosis not present

## 2018-07-14 DIAGNOSIS — S92421A Displaced fracture of distal phalanx of right great toe, initial encounter for closed fracture: Secondary | ICD-10-CM | POA: Diagnosis not present

## 2018-07-14 DIAGNOSIS — Z23 Encounter for immunization: Secondary | ICD-10-CM | POA: Diagnosis not present

## 2018-07-14 DIAGNOSIS — Z8719 Personal history of other diseases of the digestive system: Secondary | ICD-10-CM | POA: Diagnosis not present

## 2018-07-14 MED ORDER — FENTANYL CITRATE (PF) 100 MCG/2ML IJ SOLN
50.0000 ug | INTRAMUSCULAR | Status: DC | PRN
  Administered 2018-07-14 (×3): 50 ug via INTRAVENOUS
  Filled 2018-07-14 (×3): qty 2

## 2018-07-14 NOTE — ED Notes (Signed)
[facility].  This RN gave report to [facility]ge RN [facility].  All belongings taken with transportation service.  No belongings in the Security [facility].

## 2018-07-14 NOTE — ED Provider Notes (Signed)
I spoke to Dr. Doran HeaterMarcellino with the ENT. She will see the patient in the ER, more than likely will need to go to the OR for operative management.  Patient is awake and alert but is in pain.  He has been seen by the trauma surgeon Dr. Doylene Canardonner, who does not feel he needs to be admitted to the trauma service.     Jon Park, Jon Lingenfelter, MD 07/14/18 [phone]

## 2018-07-14 NOTE — ED Notes (Signed)
Patient transported to X-ray 

## 2018-07-14 NOTE — ED Notes (Signed)
Paged E[facility]ell[facility] to [facility]

## 2018-07-14 NOTE — ED Provider Notes (Signed)
[facility] by Dr. Alvino Chapelhoi This was arranged by Dr. Merril AbbeMarcelino. Patient updated on plan.  Also noted to have toe fracture.  This can be followed up as an outpatient   Zadie RhineWickline, Child Campoy, MD 07/14/18 [phone]

## 2018-07-14 NOTE — ED Notes (Signed)
[facility].  Dr Paschal DoppEdwin Choi receiving.

## 2018-07-14 NOTE — ED Notes (Signed)
Called [facility] line for ENT trauma/face/plastics consult

## 2018-07-14 NOTE — ED Provider Notes (Signed)
Patient informed of toe injury, he is able to move the toe with flexion extension.  Buddy tape ordered.  Wound is getting cleaned by nursing staff.  [facility]  Patient is currently protecting his airway, no acute respiratory distress is noted. He is reporting right hand pain, but diffuse tenderness and swelling, but there is no obvious fractures on x-ray.  This will need to be monitored.   Zadie RhineWickline, Kenney Going, MD 07/14/18 [phone]
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
## 2018-07-14 NOTE — ED Notes (Signed)
[facility]
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
## 2018-07-19 ENCOUNTER — Emergency Department (HOSPITAL_COMMUNITY)
Admission: EM | Admit: 2018-07-19 | Discharge: 2018-07-19 | Disposition: A | Discharge status: Home / Self Care | Payer: Medicaid Other | Attending: Emergency Medicine | Admitting: Emergency Medicine

## 2018-07-19 ENCOUNTER — Encounter (HOSPITAL_COMMUNITY): Payer: Self-pay

## 2018-07-19 ENCOUNTER — Other Ambulatory Visit: Payer: Self-pay

## 2018-07-19 DIAGNOSIS — Z87891 Personal history of nicotine dependence: Secondary | ICD-10-CM | POA: Insufficient documentation

## 2018-07-19 DIAGNOSIS — M79601 Pain in right arm: Secondary | ICD-10-CM | POA: Insufficient documentation

## 2018-07-19 DIAGNOSIS — Z79899 Other long term (current) drug therapy: Secondary | ICD-10-CM | POA: Diagnosis not present

## 2018-07-19 HISTORY — DX: Rider (driver) (passenger) of other motorcycle injured in unspecified traffic accident, initial encounter: V29.99XA

## 2018-07-19 NOTE — ED Notes (Signed)
Records requested from [facility] as requested.
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
## 2018-07-19 NOTE — ED Triage Notes (Signed)
Pt wearing c collar.  Iodaform dressing to face.

## 2018-07-20 MED FILL — Ondansetron HCl Inj 4 MG/2ML (2 MG/ML): INTRAMUSCULAR | Qty: 2 | Status: AC

## 2018-08-06 ENCOUNTER — Encounter (HOSPITAL_COMMUNITY): Payer: Self-pay

## 2018-08-06 ENCOUNTER — Other Ambulatory Visit: Payer: Self-pay

## 2018-08-06 ENCOUNTER — Ambulatory Visit (HOSPITAL_COMMUNITY): Payer: Medicaid Other | Attending: Nurse Practitioner

## 2018-08-06 DIAGNOSIS — R29898 Other symptoms and signs involving the musculoskeletal system: Secondary | ICD-10-CM | POA: Diagnosis not present

## 2018-08-06 DIAGNOSIS — R600 Localized edema: Secondary | ICD-10-CM | POA: Diagnosis present

## 2018-08-06 NOTE — Patient Instructions (Addendum)
Contrast Bath  Prepare the baths.  Cold = 55-65 degrees     Hot = 105-110 degrees  Starting with the hot, dip hand or foot all the way into the water and hold there for selected duration.  Preferably 3 minutes.  After selected duration is up, dip hand or foot into the cold for 1/3 duration of the hot. (3 minutes hot, 1 minute cold)  Alternate back and forth for the times indicated for no more than a total of 20 minutes ending with hot.   Home Exercises Program Theraputty Exercises  Do the following exercises 2-3 times a day using your affected hand.  1. Roll putty into a ball.  2. Make into a pancake.  3. Roll putty into a roll.  4. Pinch along log with first finger and thumb.   5. Make into a ball.  6. Roll it back into a log.   7. Pinch using thumb and side of first finger.  8. Roll into a ball, then flatten into a pancake.  9. Using your fingers, make putty into a mountain.  10. Squeeze and release 10 times.     Complete the following exercises 10-12 times. 2-3 times.   ELASTIC BAND SHOULDER EXTERNAL ROTATION - ER  While holding an elastic band at your side with your elbow bent, start with your hand near your stomach and then pull the band away. Keep your elbow at your side the entire time.    ELASTIC BAND EXTENSION BILATERAL SHOULDER  While holding an elastic band with both arms in front of you with your elbows straight, pull the band downwards and back towards your side.      ELASTIC BAND SHOULDER ABDUCTION  While holding an elastic band at your side, draw up your arm to the side keeping your elbow straight.     ELASTIC BAND - HORIZONTAL ABDUCTION  Start by holding an elastic band or tubing with your arm out-stretched in front of you and across your body towards the opposite side.  Next, pull the elastic band or cord horizontally and outward as shown.   Your elbow should be straight or slightly bent the entire time.
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
## 2018-08-13 ENCOUNTER — Ambulatory Visit (HOSPITAL_COMMUNITY): Payer: Medicaid Other

## 2018-08-13 ENCOUNTER — Encounter (HOSPITAL_COMMUNITY): Payer: Self-pay

## 2018-08-13 DIAGNOSIS — R29898 Other symptoms and signs involving the musculoskeletal system: Secondary | ICD-10-CM

## 2018-08-13 DIAGNOSIS — R600 Localized edema: Secondary | ICD-10-CM

## 2018-08-13 NOTE — Patient Instructions (Addendum)
FREE WEIGHT - EXTERNAL ROTATION - ER (4 lb weight) 10 reps Lie on your side and hold a weight with your elbow bent and rested on your side. Place a small rolled up towel between your upper arm and body. Next, move your forearm and hand from the ground towards the ceiling as shown. Lower back down and repeat.      SIDELYING ABDUCTION - FREE WEIGHT (5lb weight) 10 reps   Begin by lying on your side with your arm at your side and holding a dumbbell. Next, slowly raise up the arm towards the ceiling until your arm is straight up. Return to original position.      Sidelying Shoulder Flexion (3 lb weight) 10 reps  Lie on your side with your affected arm up. Start with the weight in your top hand by your side. Lift the weight forward and pull your shoulder blade down as the arm lifts up (like a seesaw). Control and lower the weight. Repeat.       Sidelying Horizontal Abduction (4 lb weight) 10 reps  Lie down on side.  Keep shoulder at 90 degrees of flexion. Lift the arm up toward the sky keeping the elbow straight, and the shoulder in the 90 deg flexion plane. control back down to start position.  Do not cross parallel to the floor.
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
## 2018-08-16 ENCOUNTER — Encounter (HOSPITAL_COMMUNITY): Payer: Self-pay

## 2018-08-16 ENCOUNTER — Ambulatory Visit (HOSPITAL_COMMUNITY): Payer: Medicaid Other

## 2018-08-16 DIAGNOSIS — R29898 Other symptoms and signs involving the musculoskeletal system: Secondary | ICD-10-CM

## 2018-08-16 DIAGNOSIS — R600 Localized edema: Secondary | ICD-10-CM
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
## 2018-08-18 ENCOUNTER — Encounter (HOSPITAL_COMMUNITY): Payer: Self-pay

## 2018-08-18 ENCOUNTER — Ambulatory Visit (HOSPITAL_COMMUNITY): Payer: Medicaid Other

## 2018-08-18 DIAGNOSIS — R29898 Other symptoms and signs involving the musculoskeletal system: Secondary | ICD-10-CM

## 2018-08-18 DIAGNOSIS — R600 Localized edema: Secondary | ICD-10-CM

## 2018-08-18 NOTE — Patient Instructions (Signed)
Complete 10-15 reps 2-3 times a day using red band.   ELASTIC BAND SHOULDER FLEXION  While holding an elastic band at your side, draw up your arm up in front of you keeping your elbow straight.
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
## 2018-08-25 ENCOUNTER — Ambulatory Visit (HOSPITAL_COMMUNITY): Payer: Medicaid Other

## 2018-08-25 ENCOUNTER — Encounter (HOSPITAL_COMMUNITY): Payer: Self-pay

## 2018-08-25 DIAGNOSIS — R29898 Other symptoms and signs involving the musculoskeletal system: Secondary | ICD-10-CM

## 2018-08-25 DIAGNOSIS — R600 Localized edema: Secondary | ICD-10-CM
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
## 2018-08-27 ENCOUNTER — Ambulatory Visit (HOSPITAL_COMMUNITY): Payer: Medicaid Other | Attending: Nurse Practitioner

## 2018-08-27 ENCOUNTER — Encounter (HOSPITAL_COMMUNITY): Payer: Self-pay

## 2018-08-27 DIAGNOSIS — R600 Localized edema: Secondary | ICD-10-CM | POA: Insufficient documentation

## 2018-08-27 DIAGNOSIS — R29898 Other symptoms and signs involving the musculoskeletal system: Secondary | ICD-10-CM | POA: Diagnosis present
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
## 2018-08-31 ENCOUNTER — Ambulatory Visit (HOSPITAL_COMMUNITY): Payer: Medicaid Other

## 2018-08-31 ENCOUNTER — Encounter (HOSPITAL_COMMUNITY): Payer: Self-pay

## 2018-08-31 DIAGNOSIS — R29898 Other symptoms and signs involving the musculoskeletal system: Secondary | ICD-10-CM

## 2018-08-31 DIAGNOSIS — R600 Localized edema: Secondary | ICD-10-CM
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
## 2018-09-03 ENCOUNTER — Encounter (HOSPITAL_COMMUNITY): Payer: Self-pay | Admitting: Occupational Therapy

## 2018-09-03 ENCOUNTER — Ambulatory Visit (HOSPITAL_COMMUNITY): Payer: Medicaid Other | Admitting: Occupational Therapy

## 2018-09-03 DIAGNOSIS — R600 Localized edema: Secondary | ICD-10-CM

## 2018-09-03 DIAGNOSIS — R29898 Other symptoms and signs involving the musculoskeletal system: Secondary | ICD-10-CM
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
## 2018-09-08 ENCOUNTER — Ambulatory Visit (HOSPITAL_COMMUNITY): Payer: Medicaid Other

## 2018-09-08 ENCOUNTER — Encounter (HOSPITAL_COMMUNITY): Payer: Self-pay

## 2018-09-08 DIAGNOSIS — R29898 Other symptoms and signs involving the musculoskeletal system: Secondary | ICD-10-CM | POA: Diagnosis not present
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
## 2018-09-10 ENCOUNTER — Encounter (HOSPITAL_COMMUNITY): Payer: Self-pay | Admitting: Occupational Therapy

## 2018-09-10 ENCOUNTER — Ambulatory Visit (HOSPITAL_COMMUNITY): Payer: Medicaid Other | Admitting: Occupational Therapy

## 2018-09-10 DIAGNOSIS — R29898 Other symptoms and signs involving the musculoskeletal system: Secondary | ICD-10-CM | POA: Diagnosis not present
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
## 2018-09-15 ENCOUNTER — Ambulatory Visit (HOSPITAL_COMMUNITY): Payer: Medicaid Other

## 2018-09-17 ENCOUNTER — Encounter (HOSPITAL_COMMUNITY): Payer: Self-pay

## 2018-09-17 ENCOUNTER — Ambulatory Visit (HOSPITAL_COMMUNITY): Payer: Medicaid Other

## 2018-09-17 DIAGNOSIS — R29898 Other symptoms and signs involving the musculoskeletal system: Secondary | ICD-10-CM | POA: Diagnosis not present
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
## 2018-09-21 ENCOUNTER — Encounter (HOSPITAL_COMMUNITY): Payer: Medicaid Other

## 2018-09-22 ENCOUNTER — Encounter (HOSPITAL_COMMUNITY): Payer: Medicaid Other

## 2018-09-28 ENCOUNTER — Encounter (HOSPITAL_COMMUNITY): Payer: Medicaid Other

## 2018-10-01 ENCOUNTER — Encounter (HOSPITAL_COMMUNITY): Payer: Medicaid Other

## 2019-05-02 IMAGING — DX DG CHEST 2V
2 series · 2 of 2 positions shown · non-contrast
Comparison: Chest radiograph May 19, 2010 and chest CT May 19, 2010

CLINICAL DATA: Cough and shortness of breath

EXAM:
CHEST - 2 VIEW

[chest pa]
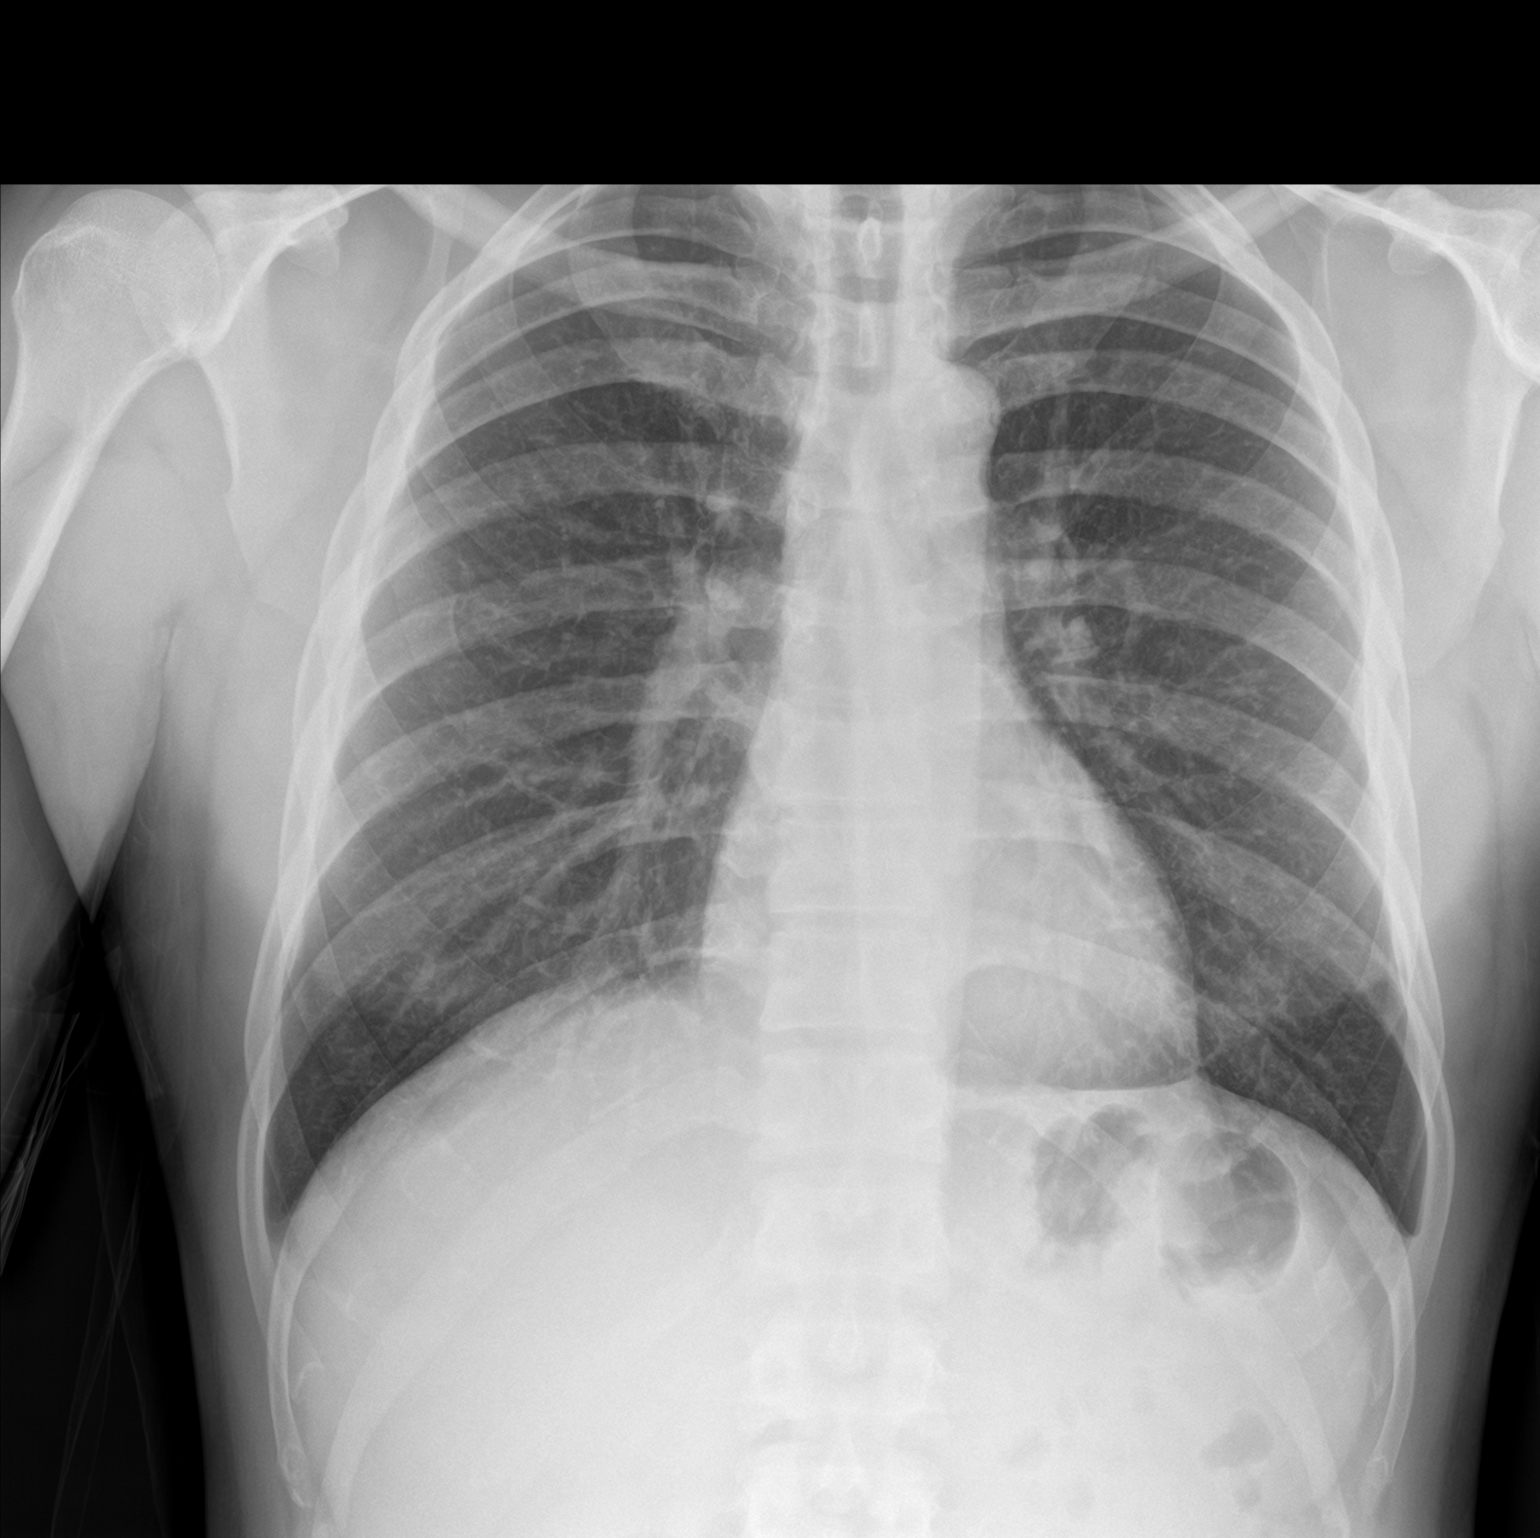

[chest lat]
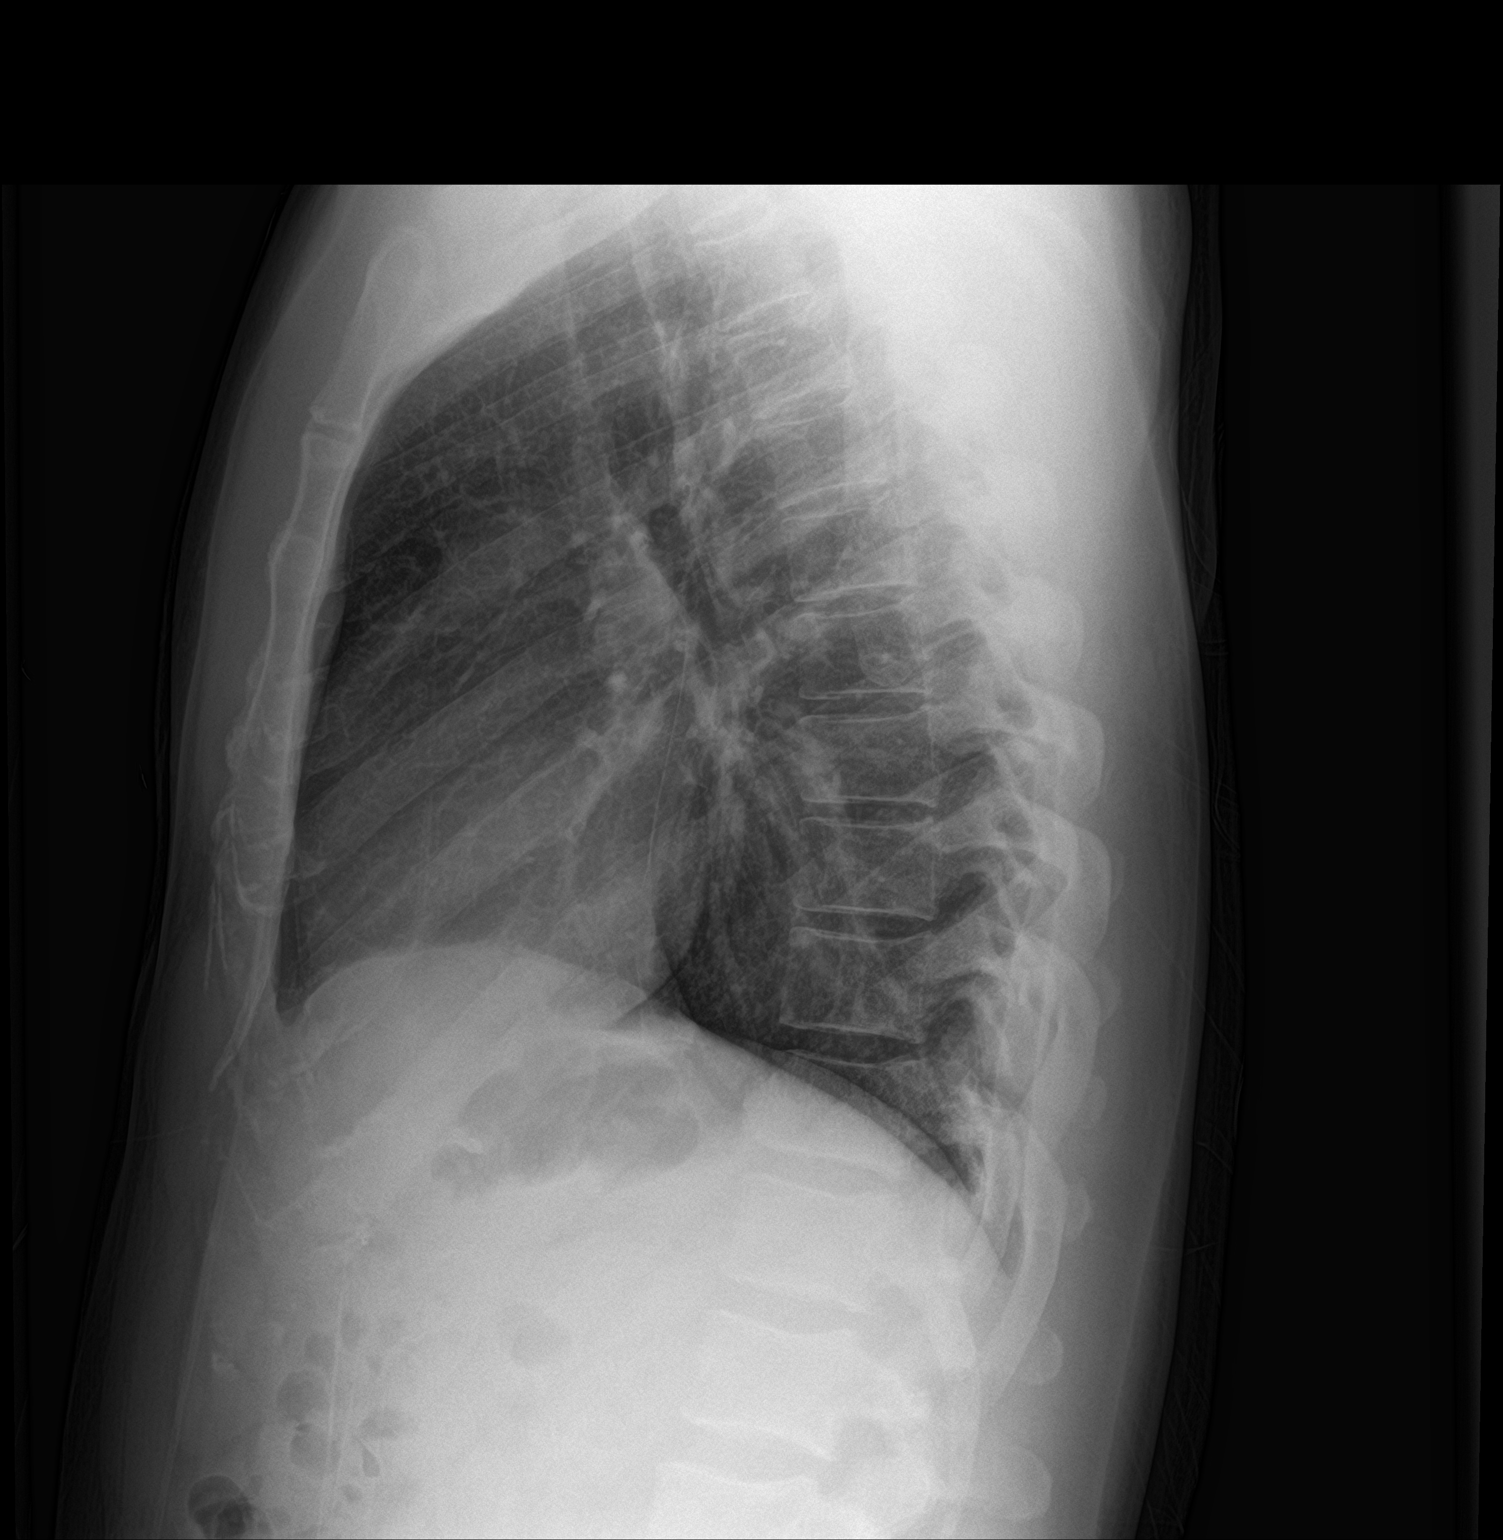

[2 of 2 positions shown; findings below may reference images not displayed]

FINDINGS: There is no edema or consolidation. The heart size and pulmonary
vascularity are normal. No adenopathy. No bone lesions.
IMPRESSION: No edema or consolidation.
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
## 2024-09-17 ENCOUNTER — Emergency Department (HOSPITAL_COMMUNITY)
Admission: EM | Admit: 2024-09-17 | Discharge: 2024-09-17 | Disposition: A | Discharge status: Home / Self Care | Attending: Emergency Medicine | Admitting: Emergency Medicine

## 2024-09-17 ENCOUNTER — Emergency Department (HOSPITAL_COMMUNITY)

## 2024-09-17 ENCOUNTER — Other Ambulatory Visit: Payer: Self-pay

## 2024-09-17 DIAGNOSIS — S299XXA Unspecified injury of thorax, initial encounter: Secondary | ICD-10-CM | POA: Diagnosis present

## 2024-09-17 DIAGNOSIS — Z79899 Other long term (current) drug therapy: Secondary | ICD-10-CM | POA: Insufficient documentation

## 2024-09-17 DIAGNOSIS — Y92410 Unspecified street and highway as the place of occurrence of the external cause: Secondary | ICD-10-CM | POA: Insufficient documentation

## 2024-09-17 DIAGNOSIS — S0990XA Unspecified injury of head, initial encounter: Secondary | ICD-10-CM | POA: Insufficient documentation

## 2024-09-17 DIAGNOSIS — S0993XA Unspecified injury of face, initial encounter: Secondary | ICD-10-CM | POA: Diagnosis present

## 2024-09-17 DIAGNOSIS — S01411A Laceration without foreign body of right cheek and temporomandibular area, initial encounter: Secondary | ICD-10-CM | POA: Insufficient documentation

## 2024-09-17 DIAGNOSIS — K5730 Diverticulosis of large intestine without perforation or abscess without bleeding: Secondary | ICD-10-CM | POA: Diagnosis not present

## 2024-09-17 DIAGNOSIS — Z23 Encounter for immunization: Secondary | ICD-10-CM | POA: Insufficient documentation

## 2024-09-17 MED ORDER — ONDANSETRON HCL 4 MG/2ML IJ SOLN
4.0000 mg | Freq: Once | INTRAMUSCULAR | Status: AC
  Administered 2024-09-17: 4 mg via INTRAVENOUS
  Filled 2024-09-17: qty 2

## 2024-09-17 MED ORDER — FENTANYL CITRATE (PF) 100 MCG/2ML IJ SOLN
50.0000 ug | Freq: Once | INTRAMUSCULAR | Status: AC
  Administered 2024-09-17: 50 ug via INTRAVENOUS
  Filled 2024-09-17: qty 2

## 2024-09-17 MED ORDER — TETANUS-DIPHTH-ACELL PERTUSSIS 5-2-15.5 LF-MCG/0.5 IM SUSP
0.5000 mL | Freq: Once | INTRAMUSCULAR | Status: AC
  Administered 2024-09-17: 0.5 mL via INTRAMUSCULAR
  Filled 2024-09-17: qty 0.5

## 2024-09-17 MED ORDER — IOHEXOL 300 MG/ML  SOLN
100.0000 mL | Freq: Once | INTRAMUSCULAR | Status: AC | PRN
  Administered 2024-09-17: 100 mL via INTRAVENOUS

## 2024-09-17 NOTE — ED Notes (Signed)
 Pt had pain during ambulation but ambulated very well.
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
## 2024-10-06 ENCOUNTER — Other Ambulatory Visit: Payer: Self-pay

## 2024-10-06 DIAGNOSIS — M542 Cervicalgia: Secondary | ICD-10-CM

## 2024-10-06 DIAGNOSIS — G8911 Acute pain due to trauma: Secondary | ICD-10-CM

## 2024-10-06 DIAGNOSIS — G4489 Other headache syndrome: Secondary | ICD-10-CM

## 2024-10-06 DIAGNOSIS — M5412 Radiculopathy, cervical region: Secondary | ICD-10-CM

## 2024-10-10 ENCOUNTER — Inpatient Hospital Stay: Admission: RE | Admit: 2024-10-10 | Discharge: 2024-10-10

## 2024-10-10 DIAGNOSIS — M5412 Radiculopathy, cervical region: Secondary | ICD-10-CM

## 2024-10-10 DIAGNOSIS — G4489 Other headache syndrome: Secondary | ICD-10-CM

## 2024-10-10 DIAGNOSIS — M542 Cervicalgia: Secondary | ICD-10-CM

## 2024-10-10 DIAGNOSIS — G8911 Acute pain due to trauma: Secondary | ICD-10-CM
